# Patient Record
Sex: Male | Born: 1990 | Race: Black or African American | Hispanic: No | Marital: Single | Smoking: Never smoker
Health system: Southern US, Community
[De-identification: ages and names within clinical notes are randomized; demographics above are authoritative.]

---
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
## 2011-12-14 IMAGING — US US PELVIS LIMITED
1 series · 17 of 25 positions shown · non-contrast
Comparison: none

REASON FOR EXAM: Hyrdocele
COMMENTS:

[Series 1: us pelvis limited · 17 of 47 slices shown]
[im 1/47]
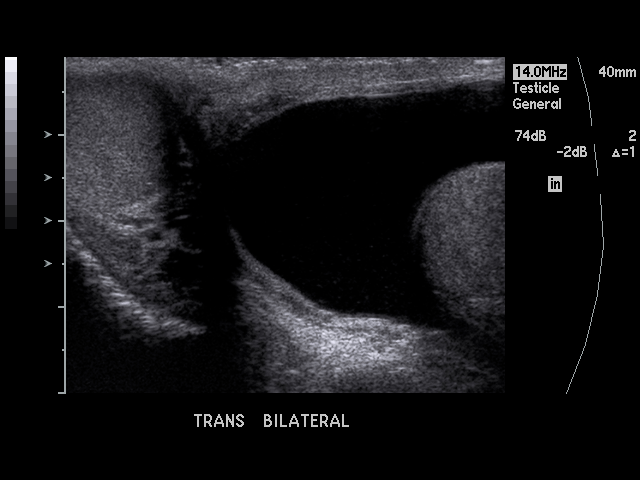
[im 4/47]
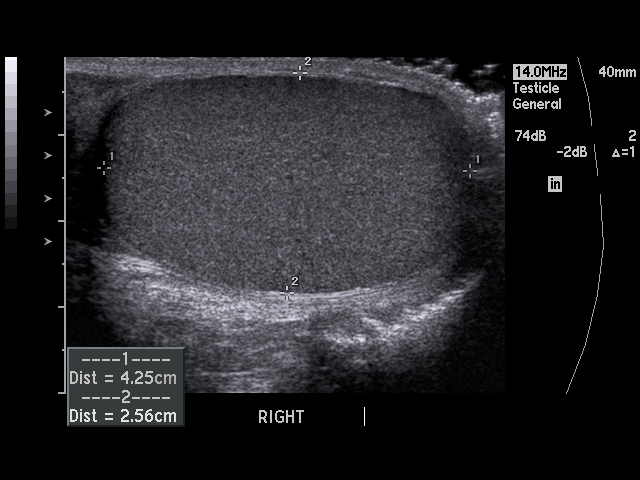
[im 6/47]
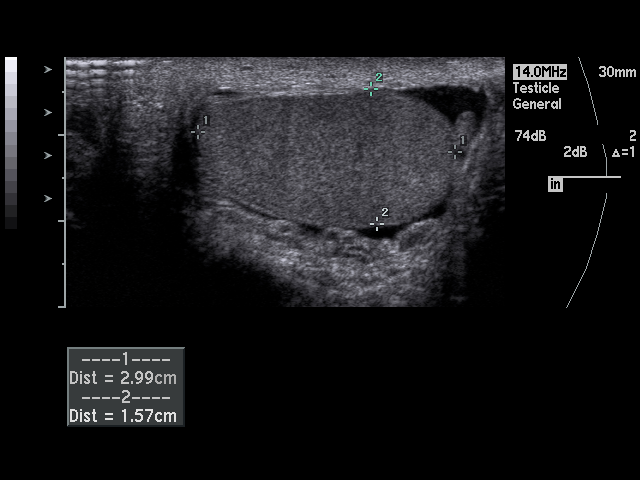
[im 10/47]
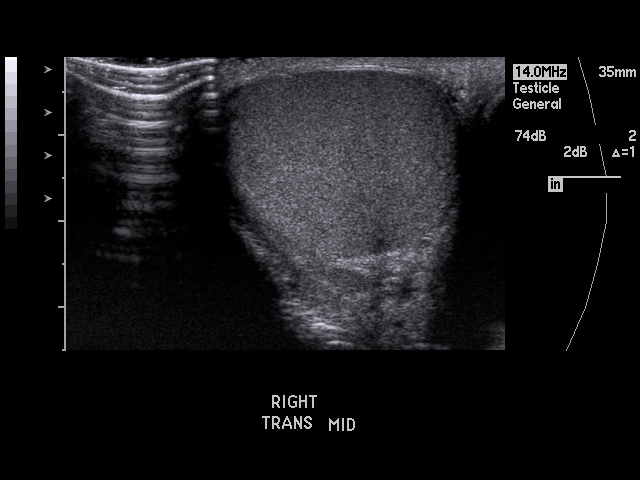
[im 12/47]
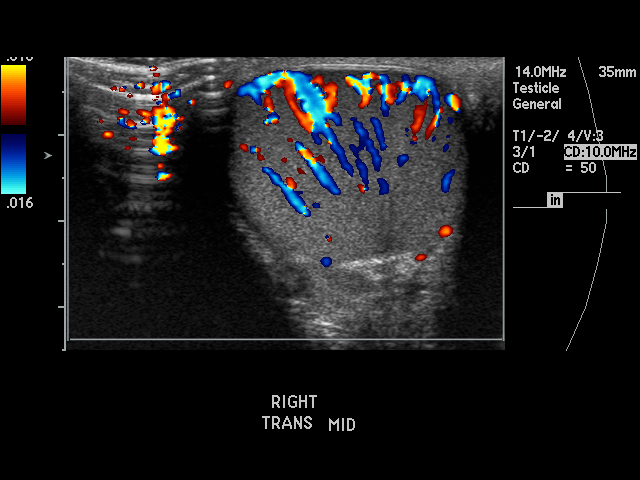
[im 16/47]
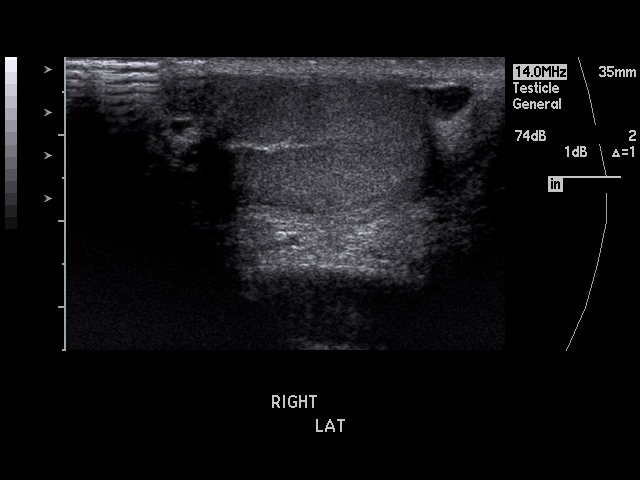
[im 18/47]
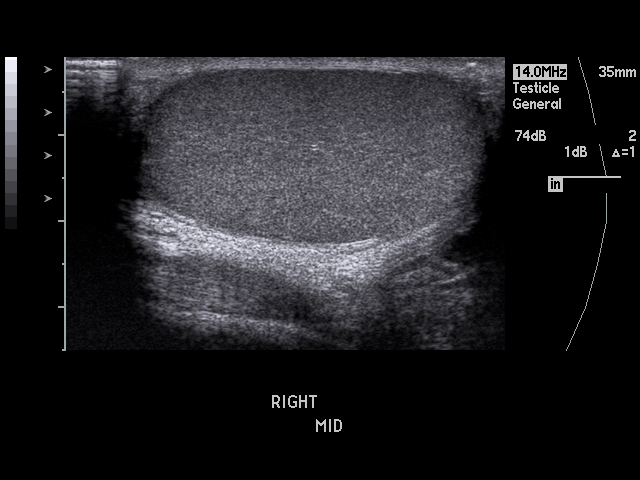
[im 22/47]
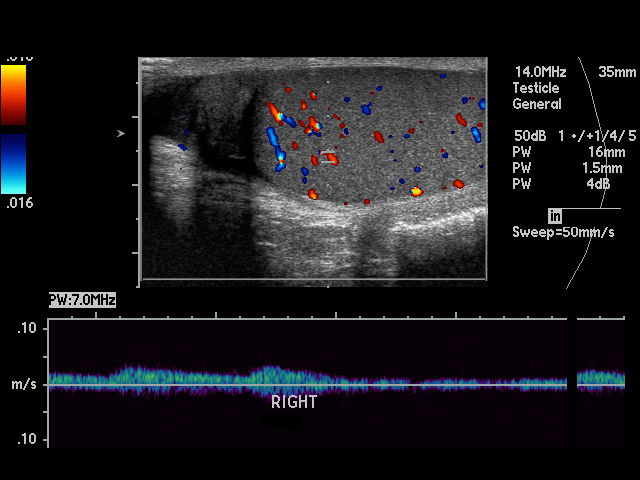
[im 24/47]
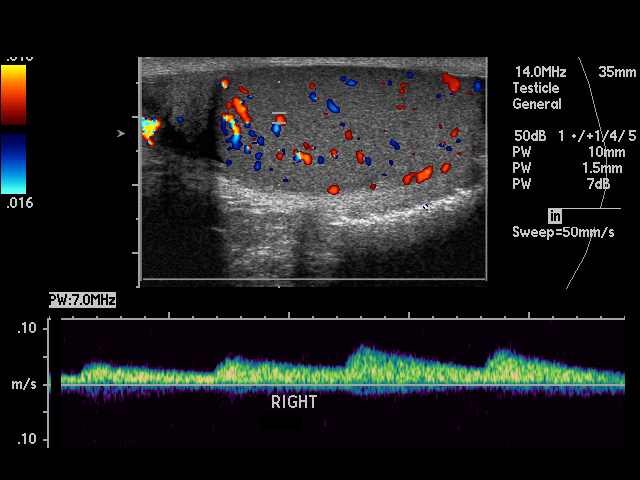
[im 25/47]
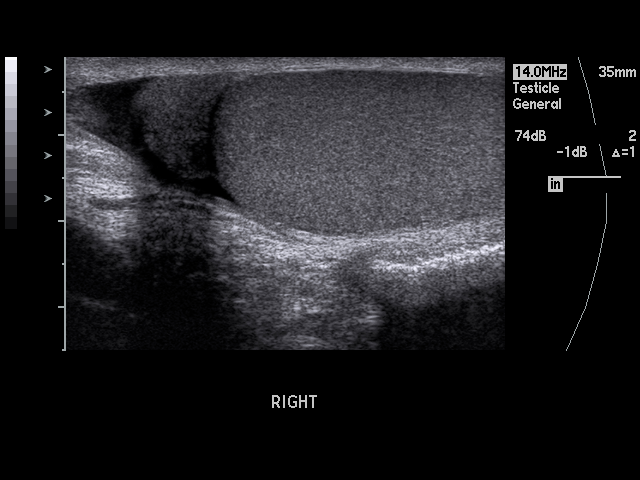
[im 29/47]
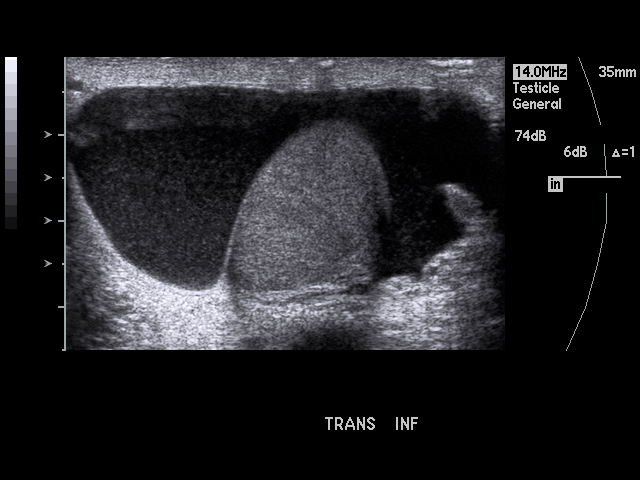
[im 31/47]
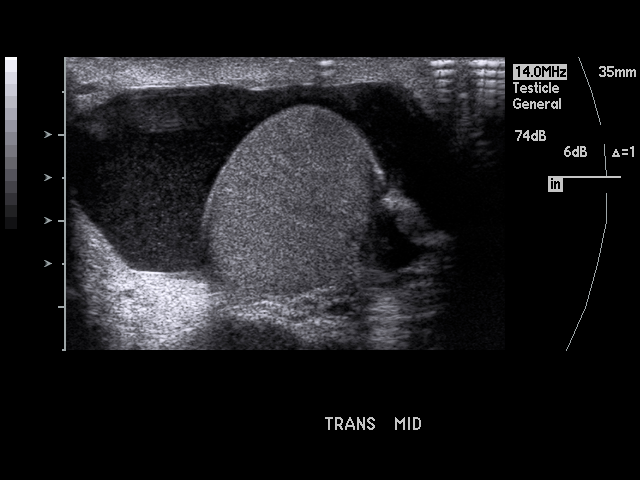
[im 35/47]
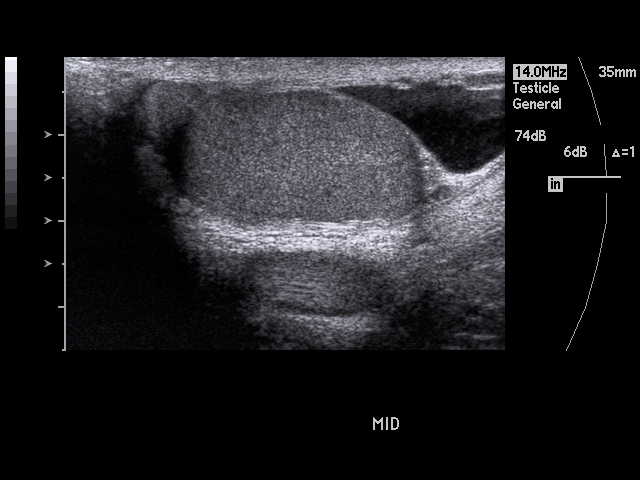
[im 37/47]
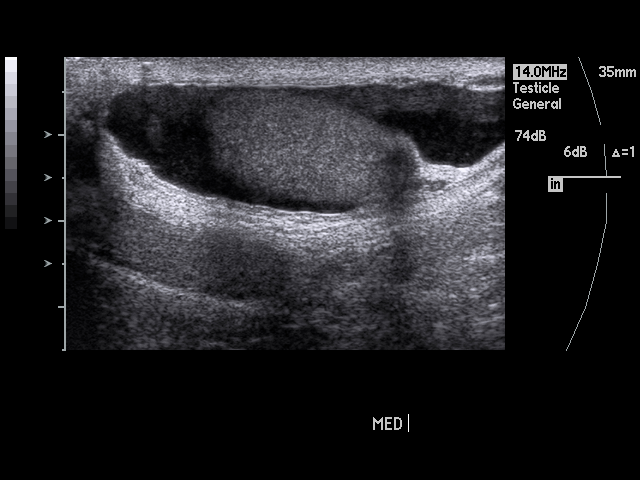
[im 41/47]
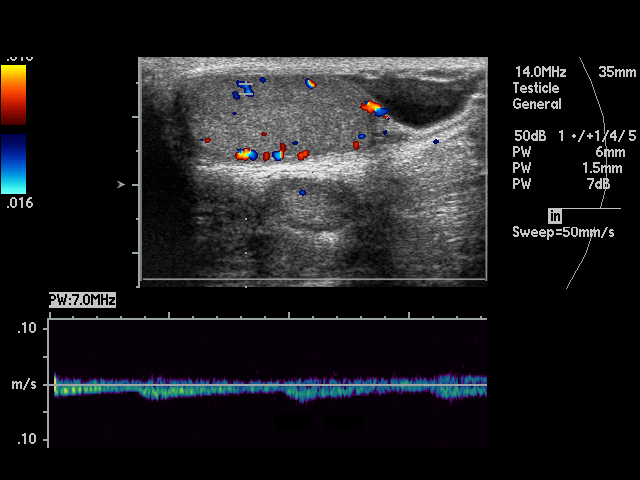
[im 43/47]
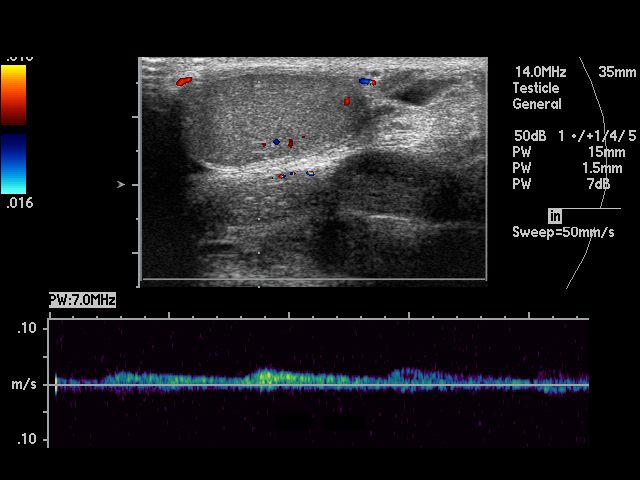
[im 47/47]
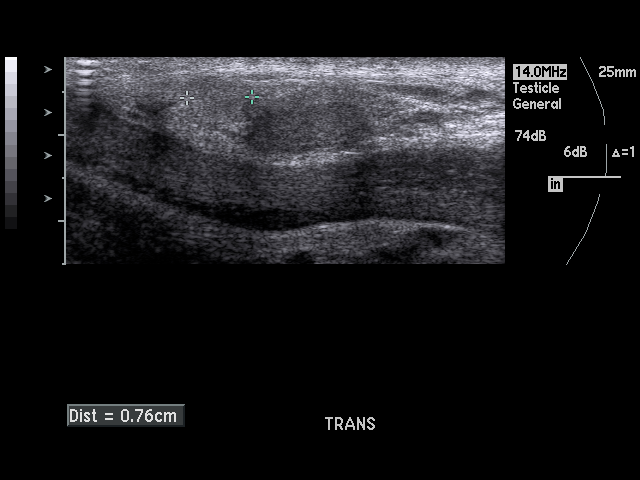

[17 of 25 positions shown; findings below may reference images not displayed]

PROCEDURE:     US  - US TESTICULAR  - March 06, 2010 [DATE]

RESULT:     Ultrasound of the scrotum demonstrates the right testicle is
homogeneous in echotexture measuring 4.25 x 2.56 x 2.59 cm. The left
testicle is also homogeneous measuring 4.57 x 2.99 x 2.15 cm. Color and
Spectral Doppler interrogation demonstrates normal arterial and venous blood
flow bilaterally. There is a left hydrocele present which according to the
patient has been present chronically. The epididymal appearance is normal.
IMPRESSION: Left hydrocele. No focal mass or evidence of testicular torsion.

## 2015-08-24 ENCOUNTER — Ambulatory Visit (INDEPENDENT_AMBULATORY_CARE_PROVIDER_SITE_OTHER): Payer: BLUE CROSS/BLUE SHIELD | Admitting: Podiatry

## 2015-08-24 ENCOUNTER — Ambulatory Visit (INDEPENDENT_AMBULATORY_CARE_PROVIDER_SITE_OTHER): Payer: BLUE CROSS/BLUE SHIELD

## 2015-08-24 ENCOUNTER — Encounter: Payer: Self-pay | Admitting: Podiatry

## 2015-08-24 VITALS — BP 114/56 | HR 67 | Resp 16 | Ht 68.0 in | Wt 142.0 lb

## 2015-08-24 DIAGNOSIS — M79674 Pain in right toe(s): Secondary | ICD-10-CM

## 2015-08-24 DIAGNOSIS — M79675 Pain in left toe(s): Secondary | ICD-10-CM | POA: Diagnosis not present

## 2015-08-24 DIAGNOSIS — M779 Enthesopathy, unspecified: Secondary | ICD-10-CM | POA: Diagnosis not present

## 2015-08-24 DIAGNOSIS — M205X9 Other deformities of toe(s) (acquired), unspecified foot: Secondary | ICD-10-CM | POA: Diagnosis not present

## 2015-08-24 NOTE — Progress Notes (Signed)
   Subjective:    Patient ID: Donald LevinsWilliam Cross, male    DOB: [birth year], 25 y.o.   MRN: [id number]  HPI Chief Complaint  Patient presents with  . Toe Pain    Bilateral; great toes; joint pain; swelling; pt stated, "Was told by primary care doctor not to wear his boots 2 weeks ago; Has had pain since Jan. 2017"      Review of Systems  All other systems reviewed and are negative.      Objective:   Physical Exam        Assessment & Plan:

## 2015-08-25 NOTE — Progress Notes (Signed)
Subjective:     Patient ID: Donald LevinsWilliam Cross, male   DOB: 01-13-1991, 25 y.o.   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      Objective:   Physical Exam  Constitutional: He is oriented to person, place, and time.  Cardiovascular: Intact distal pulses.   Musculoskeletal: Normal range of motion.  Neurological: He is oriented to person, place, and time.  Skin: Skin is warm.  Nursing note and vitals reviewed.  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 Patient's found have good digital perfusion and is well oriented 3 with mild equinus and moderate flatfoot deformity     Assessment:     Probable mechanical structure leading to inflammation and discomfort of the hallux bilateral    Plan:     H&P and x-rays that he brought were reviewed. Today I made him an orthotic with a Morton's extension to try to stabilize the hallux and take pressure off the joint surface. Patient will be seen back when those are returned and was given instructions on padding and I dispensed silicone pads for the big toes

## 2015-09-14 ENCOUNTER — Ambulatory Visit (INDEPENDENT_AMBULATORY_CARE_PROVIDER_SITE_OTHER): Payer: BLUE CROSS/BLUE SHIELD | Admitting: *Deleted

## 2015-09-14 DIAGNOSIS — M779 Enthesopathy, unspecified: Secondary | ICD-10-CM | POA: Diagnosis not present

## 2015-09-14 NOTE — Patient Instructions (Signed)

## 2015-09-14 NOTE — Progress Notes (Signed)
Patient ID: Donald LevinsWilliam Cross, male   DOB: Jun 24, 1990, 24 y.o.   MRN: [id number] Patient presents for orthotic pick up.  Verbal and written break in and wear instructions given.  Patient will follow up in 4 weeks if symptoms worsen or fail to improve.

## 2015-10-06 ENCOUNTER — Inpatient Hospital Stay (HOSPITAL_COMMUNITY)
Admission: EM | Admit: 2015-10-06 | Discharge: 2015-10-09 | DRG: 331 | Disposition: A | Discharge status: Home / Self Care | Payer: Worker's Compensation | Attending: Surgery | Admitting: Surgery

## 2015-10-06 ENCOUNTER — Observation Stay (HOSPITAL_COMMUNITY): Payer: Worker's Compensation | Admitting: Anesthesiology

## 2015-10-06 ENCOUNTER — Encounter (HOSPITAL_COMMUNITY): Payer: Self-pay | Admitting: Emergency Medicine

## 2015-10-06 ENCOUNTER — Encounter (HOSPITAL_COMMUNITY): Admission: EM | Disposition: A | Discharge status: Home / Self Care | Payer: Self-pay | Source: Home / Self Care

## 2015-10-06 DIAGNOSIS — X500XXA Overexertion from strenuous movement or load, initial encounter: Secondary | ICD-10-CM | POA: Diagnosis not present

## 2015-10-06 DIAGNOSIS — R1030 Lower abdominal pain, unspecified: Secondary | ICD-10-CM | POA: Diagnosis present

## 2015-10-06 DIAGNOSIS — K4030 Unilateral inguinal hernia, with obstruction, without gangrene, not specified as recurrent: Secondary | ICD-10-CM | POA: Diagnosis present

## 2015-10-06 DIAGNOSIS — K4090 Unilateral inguinal hernia, without obstruction or gangrene, not specified as recurrent: Secondary | ICD-10-CM

## 2015-10-06 HISTORY — PX: INGUINAL HERNIA REPAIR: SHX194

## 2015-10-06 LAB — BASIC METABOLIC PANEL
ANION GAP: 8 (ref 5–15)
BUN: 12 mg/dL (ref 6–20)
CHLORIDE: 104 mmol/L (ref 101–111)
CO2: 26 mmol/L (ref 22–32)
Calcium: 9.2 mg/dL (ref 8.9–10.3)
Creatinine, Ser: 1.25 mg/dL — ABNORMAL HIGH (ref 0.61–1.24)
GFR calc non Af Amer: >60 mL/min (ref >60)
Glucose, Bld: 141 mg/dL — ABNORMAL HIGH (ref 65–99)
POTASSIUM: 3 mmol/L — AB (ref 3.5–5.1)
Sodium: 138 mmol/L (ref 135–145)

## 2015-10-06 LAB — CBC WITH DIFFERENTIAL/PLATELET
BASOS ABS: 0 10*3/uL (ref 0.0–0.1)
BASOS PCT: 0 %
Eosinophils Absolute: 0 10*3/uL (ref 0.0–0.7)
Eosinophils Relative: 0 %
HEMATOCRIT: 45.4 % (ref 39.0–52.0)
HEMOGLOBIN: 16.1 g/dL (ref 13.0–17.0)
Lymphocytes Relative: 5 %
Lymphs Abs: 0.8 10*3/uL (ref 0.7–4.0)
MCH: 30.7 pg (ref 26.0–34.0)
MCHC: 35.5 g/dL (ref 30.0–36.0)
MCV: 86.5 fL (ref 78.0–100.0)
MONOS PCT: 3 %
Monocytes Absolute: 0.4 10*3/uL (ref 0.1–1.0)
NEUTROS ABS: 13.8 10*3/uL — AB (ref 1.7–7.7)
NEUTROS PCT: 92 %
Platelets: 223 10*3/uL (ref 150–400)
RBC: 5.25 MIL/uL (ref 4.22–5.81)
RDW: 11.9 % (ref 11.5–15.5)
WBC: 15 10*3/uL — ABNORMAL HIGH (ref 4.0–10.5)

## 2015-10-06 SURGERY — REPAIR, HERNIA, INGUINAL, ADULT
Anesthesia: General | Site: Groin | Laterality: Left

## 2015-10-06 MED ORDER — BUPIVACAINE LIPOSOME 1.3 % IJ SUSP
20.0000 mL | Freq: Once | INTRAMUSCULAR | Status: AC
  Administered 2015-10-06: 11 mL
  Filled 2015-10-06: qty 20

## 2015-10-06 MED ORDER — CEFAZOLIN SODIUM-DEXTROSE 2-4 GM/100ML-% IV SOLN
2.0000 g | INTRAVENOUS | Status: AC
  Administered 2015-10-06: 2 g via INTRAVENOUS

## 2015-10-06 MED ORDER — ONDANSETRON 4 MG PO TBDP: 4.0000 mg | ORAL_TABLET | Freq: Four times a day (QID) | ORAL | Status: DC | PRN

## 2015-10-06 MED ORDER — KCL IN DEXTROSE-NACL 20-5-0.9 MEQ/L-%-% IV SOLN
INTRAVENOUS | Status: DC
  Administered 2015-10-06 – 2015-10-09 (×4): via INTRAVENOUS
  Filled 2015-10-06 (×5): qty 1000

## 2015-10-06 MED ORDER — DEXAMETHASONE SODIUM PHOSPHATE 10 MG/ML IJ SOLN
INTRAMUSCULAR | Status: DC | PRN
  Administered 2015-10-06: 10 mg via INTRAVENOUS

## 2015-10-06 MED ORDER — DIPHENHYDRAMINE HCL 25 MG PO CAPS: 25.0000 mg | ORAL_CAPSULE | Freq: Four times a day (QID) | ORAL | Status: DC | PRN

## 2015-10-06 MED ORDER — 0.9 % SODIUM CHLORIDE (POUR BTL) OPTIME
TOPICAL | Status: DC | PRN
  Administered 2015-10-06: 1000 mL

## 2015-10-06 MED ORDER — PHENYLEPHRINE HCL 10 MG/ML IJ SOLN
INTRAMUSCULAR | Status: DC | PRN
  Administered 2015-10-06: 80 ug via INTRAVENOUS

## 2015-10-06 MED ORDER — CEFAZOLIN SODIUM-DEXTROSE 2-4 GM/100ML-% IV SOLN
2.0000 g | Freq: Three times a day (TID) | INTRAVENOUS | Status: AC
  Administered 2015-10-07: 2 g via INTRAVENOUS
  Filled 2015-10-06: qty 100

## 2015-10-06 MED ORDER — FENTANYL CITRATE (PF) 250 MCG/5ML IJ SOLN
INTRAMUSCULAR | Status: AC
  Filled 2015-10-06: qty 5

## 2015-10-06 MED ORDER — FENTANYL CITRATE (PF) 250 MCG/5ML IJ SOLN
INTRAMUSCULAR | Status: DC | PRN
  Administered 2015-10-06 (×3): 50 ug via INTRAVENOUS

## 2015-10-06 MED ORDER — CEFAZOLIN SODIUM-DEXTROSE 2-4 GM/100ML-% IV SOLN
INTRAVENOUS | Status: AC
  Filled 2015-10-06: qty 100

## 2015-10-06 MED ORDER — BUPIVACAINE-EPINEPHRINE 0.5% -1:200000 IJ SOLN
INTRAMUSCULAR | Status: AC
  Filled 2015-10-06: qty 1

## 2015-10-06 MED ORDER — SUCCINYLCHOLINE CHLORIDE 20 MG/ML IJ SOLN
INTRAMUSCULAR | Status: DC | PRN
  Administered 2015-10-06: 100 mg via INTRAVENOUS

## 2015-10-06 MED ORDER — MEPERIDINE HCL 50 MG/ML IJ SOLN: 6.2500 mg | INTRAMUSCULAR | Status: DC | PRN

## 2015-10-06 MED ORDER — HYDROMORPHONE HCL 1 MG/ML IJ SOLN
1.0000 mg | Freq: Once | INTRAMUSCULAR | Status: AC
  Administered 2015-10-06: 1 mg via INTRAVENOUS
  Filled 2015-10-06: qty 1

## 2015-10-06 MED ORDER — CEFAZOLIN SODIUM 1-5 GM-% IV SOLN: 1.0000 g | Freq: Once | INTRAVENOUS | Status: DC

## 2015-10-06 MED ORDER — PROPOFOL 10 MG/ML IV BOLUS
INTRAVENOUS | Status: DC | PRN
  Administered 2015-10-06: 150 mg via INTRAVENOUS

## 2015-10-06 MED ORDER — LACTATED RINGERS IV SOLN
INTRAVENOUS | Status: DC
  Administered 2015-10-06: 19:00:00 via INTRAVENOUS

## 2015-10-06 MED ORDER — ONDANSETRON HCL 4 MG/2ML IJ SOLN
INTRAMUSCULAR | Status: AC
  Filled 2015-10-06: qty 2

## 2015-10-06 MED ORDER — DIAZEPAM 5 MG/ML IJ SOLN
5.0000 mg | Freq: Once | INTRAMUSCULAR | Status: AC
  Administered 2015-10-06: 5 mg via INTRAVENOUS
  Filled 2015-10-06: qty 2

## 2015-10-06 MED ORDER — HYDROMORPHONE HCL 1 MG/ML IJ SOLN: 0.5000 mg | INTRAMUSCULAR | Status: DC | PRN

## 2015-10-06 MED ORDER — LACTATED RINGERS IV SOLN
INTRAVENOUS | Status: DC | PRN
  Administered 2015-10-06: 16:00:00 via INTRAVENOUS

## 2015-10-06 MED ORDER — ROCURONIUM BROMIDE 100 MG/10ML IV SOLN
INTRAVENOUS | Status: AC
  Filled 2015-10-06: qty 1

## 2015-10-06 MED ORDER — LIDOCAINE HCL (CARDIAC) 20 MG/ML IV SOLN
INTRAVENOUS | Status: AC
  Filled 2015-10-06: qty 5

## 2015-10-06 MED ORDER — SUGAMMADEX SODIUM 200 MG/2ML IV SOLN
INTRAVENOUS | Status: DC | PRN
  Administered 2015-10-06: 200 mg via INTRAVENOUS

## 2015-10-06 MED ORDER — ROCURONIUM BROMIDE 100 MG/10ML IV SOLN
INTRAVENOUS | Status: DC | PRN
  Administered 2015-10-06: 5 mg via INTRAVENOUS
  Administered 2015-10-06 (×2): 10 mg via INTRAVENOUS
  Administered 2015-10-06: 25 mg via INTRAVENOUS
  Administered 2015-10-06 (×2): 5 mg via INTRAVENOUS

## 2015-10-06 MED ORDER — DEXAMETHASONE SODIUM PHOSPHATE 10 MG/ML IJ SOLN
INTRAMUSCULAR | Status: AC
  Filled 2015-10-06: qty 1

## 2015-10-06 MED ORDER — OXYCODONE HCL 5 MG/5ML PO SOLN
5.0000 mg | Freq: Once | ORAL | Status: DC | PRN
  Filled 2015-10-06: qty 5

## 2015-10-06 MED ORDER — ONDANSETRON HCL 4 MG/2ML IJ SOLN
4.0000 mg | Freq: Once | INTRAMUSCULAR | Status: AC
  Administered 2015-10-06: 4 mg via INTRAVENOUS
  Filled 2015-10-06: qty 2

## 2015-10-06 MED ORDER — LIDOCAINE HCL (CARDIAC) 20 MG/ML IV SOLN
INTRAVENOUS | Status: DC | PRN
  Administered 2015-10-06: 60 mg via INTRATRACHEAL

## 2015-10-06 MED ORDER — POTASSIUM CHLORIDE IN NACL 20-0.9 MEQ/L-% IV SOLN
INTRAVENOUS | Status: DC
  Filled 2015-10-06: qty 1000

## 2015-10-06 MED ORDER — SUGAMMADEX SODIUM 200 MG/2ML IV SOLN
INTRAVENOUS | Status: AC
  Filled 2015-10-06: qty 2

## 2015-10-06 MED ORDER — ONDANSETRON HCL 4 MG/2ML IJ SOLN: 4.0000 mg | INTRAMUSCULAR | Status: DC | PRN

## 2015-10-06 MED ORDER — OXYCODONE HCL 5 MG PO TABS
5.0000 mg | ORAL_TABLET | ORAL | Status: DC | PRN
  Administered 2015-10-07 (×2): 5 mg via ORAL
  Administered 2015-10-07 – 2015-10-09 (×9): 10 mg via ORAL
  Filled 2015-10-06 (×7): qty 2
  Filled 2015-10-06: qty 1
  Filled 2015-10-06 (×2): qty 2
  Filled 2015-10-06: qty 1
  Filled 2015-10-06: qty 2

## 2015-10-06 MED ORDER — DIAZEPAM 5 MG/ML IJ SOLN
5.0000 mg | Freq: Once | INTRAMUSCULAR | Status: AC
  Administered 2015-10-06: 5 mg via INTRAVENOUS

## 2015-10-06 MED ORDER — METHOCARBAMOL 500 MG PO TABS: 500.0000 mg | ORAL_TABLET | Freq: Four times a day (QID) | ORAL | Status: DC | PRN

## 2015-10-06 MED ORDER — OXYCODONE HCL 5 MG PO TABS
5.0000 mg | ORAL_TABLET | Freq: Once | ORAL | Status: DC | PRN
  Filled 2015-10-06: qty 1

## 2015-10-06 MED ORDER — DIPHENHYDRAMINE HCL 50 MG/ML IJ SOLN: 25.0000 mg | Freq: Four times a day (QID) | INTRAMUSCULAR | Status: DC | PRN

## 2015-10-06 MED ORDER — BUPIVACAINE-EPINEPHRINE 0.5% -1:200000 IJ SOLN
INTRAMUSCULAR | Status: DC | PRN
  Administered 2015-10-06: 5 mL

## 2015-10-06 MED ORDER — MORPHINE SULFATE (PF) 2 MG/ML IV SOLN
2.0000 mg | INTRAVENOUS | Status: DC | PRN
  Administered 2015-10-07 – 2015-10-08 (×4): 2 mg via INTRAVENOUS
  Filled 2015-10-06 (×4): qty 1

## 2015-10-06 MED ORDER — METHOCARBAMOL 500 MG PO TABS
500.0000 mg | ORAL_TABLET | Freq: Four times a day (QID) | ORAL | Status: DC | PRN
Start: 2015-10-06 — End: 2015-10-09

## 2015-10-06 MED ORDER — ONDANSETRON HCL 4 MG/2ML IJ SOLN
INTRAMUSCULAR | Status: DC | PRN
  Administered 2015-10-06: 4 mg via INTRAVENOUS

## 2015-10-06 MED ORDER — PROPOFOL 10 MG/ML IV BOLUS
INTRAVENOUS | Status: AC
  Filled 2015-10-06: qty 20

## 2015-10-06 MED ORDER — HYDRALAZINE HCL 20 MG/ML IJ SOLN
10.0000 mg | INTRAMUSCULAR | Status: DC | PRN
Start: 2015-10-06 — End: 2015-10-06

## 2015-10-06 MED ORDER — ONDANSETRON HCL 4 MG/2ML IJ SOLN: 4.0000 mg | Freq: Four times a day (QID) | INTRAMUSCULAR | Status: DC | PRN

## 2015-10-06 MED ORDER — PHENYLEPHRINE 40 MCG/ML (10ML) SYRINGE FOR IV PUSH (FOR BLOOD PRESSURE SUPPORT)
PREFILLED_SYRINGE | INTRAVENOUS | Status: AC
  Filled 2015-10-06: qty 10

## 2015-10-06 MED ORDER — HYDROMORPHONE HCL 1 MG/ML IJ SOLN: 0.2500 mg | INTRAMUSCULAR | Status: DC | PRN

## 2015-10-06 SURGICAL SUPPLY — 43 items
BENZOIN TINCTURE PRP APPL 2/3 (GAUZE/BANDAGES/DRESSINGS) IMPLANT
BLADE SURG 15 STRL LF DISP TIS (BLADE) IMPLANT
BLADE SURG 15 STRL SS (BLADE)
CHLORAPREP W/TINT 26ML (MISCELLANEOUS) ×3 IMPLANT
CLOSURE WOUND 1/2 X4 (GAUZE/BANDAGES/DRESSINGS)
COVER SURGICAL LIGHT HANDLE (MISCELLANEOUS) IMPLANT
DECANTER SPIKE VIAL GLASS SM (MISCELLANEOUS) ×3 IMPLANT
DRAIN PENROSE 18X1/2 LTX STRL (DRAIN) ×3 IMPLANT
DRAPE INCISE IOBAN 66X45 STRL (DRAPES) IMPLANT
DRAPE LAPAROTOMY TRNSV 102X78 (DRAPE) ×3 IMPLANT
DRSG OPSITE POSTOP 4X6 (GAUZE/BANDAGES/DRESSINGS) ×3 IMPLANT
DRSG TEGADERM 4X4.75 (GAUZE/BANDAGES/DRESSINGS) IMPLANT
ELECT PENCIL ROCKER SW 15FT (MISCELLANEOUS) IMPLANT
ELECT REM PT RETURN 9FT ADLT (ELECTROSURGICAL) ×3
ELECTRODE REM PT RTRN 9FT ADLT (ELECTROSURGICAL) ×1 IMPLANT
GAUZE SPONGE 4X4 12PLY STRL (GAUZE/BANDAGES/DRESSINGS) IMPLANT
GLOVE ECLIPSE 8.0 STRL XLNG CF (GLOVE) ×6 IMPLANT
GLOVE INDICATOR 8.0 STRL GRN (GLOVE) ×3 IMPLANT
GOWN STRL REUS W/TWL XL LVL3 (GOWN DISPOSABLE) ×9 IMPLANT
KIT BASIN OR (CUSTOM PROCEDURE TRAY) ×3 IMPLANT
LIGASURE IMPACT 36 18CM CVD LR (INSTRUMENTS) ×3 IMPLANT
NEEDLE HYPO 25X1 1.5 SAFETY (NEEDLE) ×3 IMPLANT
PACK BASIC VI WITH GOWN DISP (CUSTOM PROCEDURE TRAY) IMPLANT
PACK GENERAL/GYN (CUSTOM PROCEDURE TRAY) ×3 IMPLANT
RELOAD PROXIMATE 75MM BLUE (ENDOMECHANICALS) ×9 IMPLANT
SPONGE LAP 4X18 X RAY DECT (DISPOSABLE) IMPLANT
STAPLER GUN LINEAR PROX 60 (STAPLE) ×3 IMPLANT
STAPLER PROXIMATE 75MM BLUE (STAPLE) ×3 IMPLANT
STAPLER VISISTAT 35W (STAPLE) ×3 IMPLANT
STRIP CLOSURE SKIN 1/2X4 (GAUZE/BANDAGES/DRESSINGS) IMPLANT
SUT MNCRL AB 4-0 PS2 18 (SUTURE) ×3 IMPLANT
SUT PROLENE 0 CT 1 CR/8 (SUTURE) ×3 IMPLANT
SUT PROLENE 2 0 CT2 30 (SUTURE) ×6 IMPLANT
SUT SILK 2 0 SH CR/8 (SUTURE) ×3 IMPLANT
SUT SILK 3 0 SH CR/8 (SUTURE) ×3 IMPLANT
SUT VIC AB 2-0 SH 18 (SUTURE) IMPLANT
SUT VIC AB 3-0 SH 27 (SUTURE) ×4
SUT VIC AB 3-0 SH 27XBRD (SUTURE) ×2 IMPLANT
SYR BULB IRRIGATION 50ML (SYRINGE) ×3 IMPLANT
SYR CONTROL 10ML LL (SYRINGE) ×3 IMPLANT
TOWEL OR 17X26 10 PK STRL BLUE (TOWEL DISPOSABLE) ×3 IMPLANT
TOWEL OR NON WOVEN STRL DISP B (DISPOSABLE) ×3 IMPLANT
YANKAUER SUCT BULB TIP 10FT TU (MISCELLANEOUS) IMPLANT

## 2015-10-06 NOTE — ED Provider Notes (Signed)
CSN: [id number]     Arrival date & time 10/06/15  1254 History   First MD Initiated Contact with Patient 10/06/15 1259     Chief Complaint  Patient presents with  . Hernia     (Consider location/radiation/quality/duration/timing/severity/associated sxs/prior Treatment) HPI  25 year old male who presents with hernia. He has no significant PMH or PSH. States he has otherwise been in his usual state of health and was moving furniture from a truck about 3 hours PTA to ED. Noticed he was having sudden onset of low abdominal cramping and then noticed a bulge in the left groin. Associated with one episode of nausea and vomiting. No abdominal distension. No fever or chills. No difficulty urinating, scrotal swelling or testicular pain.   History reviewed. No pertinent past medical history. History reviewed. No pertinent past surgical history. History reviewed. No pertinent family history. Social History  Substance Use Topics  . Smoking status: Never Smoker   . Smokeless tobacco: None  . Alcohol Use: None    Review of Systems 10/14 systems reviewed and are negative other than those stated in the HPI    Allergies  Review of patient's allergies indicates no known allergies.  Home Medications   Prior to Admission medications   Not on File   BP 148/75 mmHg  Pulse 58  Temp(Src) 97.7 F (36.5 C) (Oral)  Resp 18  SpO2 99% Physical Exam Physical Exam  Nursing note and vitals reviewed. Constitutional: Well developed, well nourished, non-toxic, but in moderate distress secondary to pain Head: Normocephalic and atraumatic.  Mouth/Throat: Oropharynx is clear and moist.  Neck: Normal range of motion. Neck supple.  Cardiovascular: Normal rate and regular rhythm.   Pulmonary/Chest: Effort normal and breath sounds normal.  Abdominal: Soft. No distension. Tender in the low abdomen. Palpable buldge in the left groin at the level of the inguinal ligament.  There is no rebound but presence of  voluntary guarding. Musculoskeletal: Normal range of motion.  Neurological: Alert, no facial droop, fluent speech, moves all extremities symmetrically Skin: Skin is warm and dry.  Psychiatric: Cooperative  ED Course  Procedures (including critical care time) Labs Review Labs Reviewed  CBC WITH DIFFERENTIAL/PLATELET  BASIC METABOLIC PANEL    Imaging Review No results found. I have personally reviewed and evaluated these images and lab results as part of my medical decision-making.   EKG Interpretation None      Reduction of inguinal hernia Date/Time: 3:51 PM Performed by: Lavera Guiseana Duo Lisa Milian Authorized by: Lavera Guiseana Duo Manpreet Kemmer Consent: Verbal consent obtained. Risks and benefits: risks, benefits and alternatives were discussed Consent given by: patient Required items: required blood products, implants, devices, and special equipment available Time out: Immediately prior to procedure a "time out" was called to verify the correct patient, procedure, equipment, support staff and site/side marked as required.  Patient sedated: dilaudid  Vitals: Vital signs were monitored during sedation. Patient tolerance: Patient tolerated the procedure well with no immediate complications. Left incarcerated inguinal hernia Manual pressure applied while patient in Trendelenburg. Two unsuccessful attempts at bedside.     MDM   Final diagnoses:  Left inguinal hernia    With incarcerated inguinal hernia in setting of heavy lifting. No prior abd surgeries. With inguinal hernia on exam, very tender but abdomen non-peritoneal. Given analgesics and antiemetics. Ice pack placed over groin. In trendenlenburg. Manual reduction attempted at beside x 2 without success. General surgery consulted. With valium able to reduce but immediately recurs. Surgery will take to OR for repair.  Lavera Guise, MD 10/06/15 [phone]

## 2015-10-06 NOTE — Anesthesia Procedure Notes (Signed)
Procedure Name: Intubation Date/Time: 10/06/2015 4:13 PM Performed by: Delphia GratesHANDLER, Zyanya Glaza Pre-anesthesia Checklist: Suction available, Patient being monitored, Emergency Drugs available and Patient identified Patient Re-evaluated:Patient Re-evaluated prior to inductionOxygen Delivery Method: Circle system utilized Preoxygenation: Pre-oxygenation with 100% oxygen Intubation Type: Cricoid Pressure applied and Rapid sequence Laryngoscope Size: Mac and 4 Grade View: Grade II Tube type: Oral Tube size: 7.5 mm Number of attempts: 1 Airway Equipment and Method: Stylet Placement Confirmation: ETT inserted through vocal cords under direct vision and positive ETCO2 Secured at: 21 cm Tube secured with: Tape Dental Injury: Teeth and Oropharynx as per pre-operative assessment

## 2015-10-06 NOTE — Progress Notes (Addendum)
[facility] ED CM noted pt with coverage but no pcp listed Pt mumbled "tricare" and returned to sleep Pt has [employer] via  [employer]  [facility] ED CM spoke with pt's male visitor at bedside on how to obtain an in network pcp with insurance coverage via the customer service number or [email or web address] web site  Cm reviewed ED level of care for crisis/emergent services and community pcp level of care to manage continuous or chronic medical concerns.  The pt voiced understanding CM encouraged pt and discussed pt's responsibility to verify with pt's insurance carrier that any recommended medical provider offered by any emergency room or a hospital provider is within the carrier's network. The pt's male visitor voiced understanding to discuss with pt      Entered in d/c instructions Please verify any provider recommended to you is in network Go to Please go to [email or web address], locate find a doctor area in top right corner of page to use to find in network primary care provider and specialists or call the toll free number on the back of your insurance card to speak with a customer service staff

## 2015-10-06 NOTE — Anesthesia Postprocedure Evaluation (Signed)
Anesthesia Post Note  Patient: Donald LevinsWilliam Cross  Procedure(s) Performed: Procedure(s) (LRB): HERNIA REPAIR INGUINAL ADULT (Left)  Patient location during evaluation: PACU Anesthesia Type: General Level of consciousness: awake and alert Pain management: pain level controlled Vital Signs Assessment: post-procedure vital signs reviewed and stable Respiratory status: spontaneous breathing, nonlabored ventilation, respiratory function stable and patient connected to nasal cannula oxygen Cardiovascular status: blood pressure returned to baseline and stable Postop Assessment: no signs of nausea or vomiting Anesthetic complications: no    Last Vitals:  Filed Vitals:   10/06/15 1841 10/06/15 1940  BP: 148/80 157/88  Pulse: 84 57  Temp: 37.3 C 36.5 C  Resp: 16 17    Last Pain:  Filed Vitals:   10/06/15 1943  PainSc: Asleep                 Natash Berman A

## 2015-10-06 NOTE — Progress Notes (Signed)
Patient seen and examined.  Chart reviewed.  He has an incarcerated left inguinal hernia that I can reduce but then it returns to incarcerated state after a brief period of time and needs to be reduced again.  He needs emergency repair of his left inguinal hernia, +/- mesh.  I have explained the procedure, risks, and aftercare of inguinal hernia repair.  Risks include but are not limited to bleeding, infection, wound problems, anesthesia, recurrence, bladder or intestine injury, testicular dysfunction, chronic pain, mesh problems.  He has received some sedation but seems to understand.  I have declared this an emergency.

## 2015-10-06 NOTE — H&P (Signed)
[facility] Admission Note Donald LevinsWilliam Cross 06/11/1990  [id number].   Requesting MD: Dr. Lavera Guiseana [name] Chief Complaint/Reason for Consult: left inguinal hernia  HPI:  25 y/o male with a PMH of left hydrocele [facility] sudden abdominal cramping and nausea following heavy lifting. Friend in the room with him. 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 The pain was sudden and sharp, non-radiating and associated with nausea and one episode of vomiting. No fever or chills.This has never happened before. No history of abdominal surgeries. No regular medication use.  ROS: All systems reviewed and otherwise negative except for as above  History reviewed. No pertinent family history.  History reviewed. No pertinent past medical history.  History reviewed. No pertinent past surgical history.  Social History:  reports that he has never smoked. He does not have any smokeless tobacco history on file. His alcohol and drug histories are not on file.  Allergies: No Known Allergies   (Not in a hospital admission)  Blood pressure 148/75, pulse 58, temperature 97.7 F (36.5 C), temperature source Oral, resp. rate 18, SpO2 99 %. Physical Exam: General: pleasant, thin AA male who is laying in bed in mild distress HEENT: head is normocephalic, atraumatic.  Heart: regular, rate, and rhythm. No obvious murmurs, gallops, or rubs noted. Palpable pedal pulses bilaterally Lungs: CTAB, no wheezes, rhonchi, or rales noted. Respiratory effort nonlabored Abd: soft, TTP of lower quadrants, ND, +BS, left inguinal hernia that is reducible but recurrent. Left hydrocele. MS: all 4 extremities are symmetrical with no cyanosis, clubbing, or edema. Skin: warm and dry with no masses, lesions, or rashes Psych: A&Ox3 with an appropriate affect.   Lab Results Last 48 Hours    No results found for this or any previous visit (from the past 48 hour(s)).    Imaging  Results (Last 48 hours)    No results found.    Assessment/Plan 1. Admit for emergent left hernia repair - risks including bleeding, damage to surrounding structures, testicular pain, rejection of mesh, chronic testicular/abdominal pain, and respiratory compromise discussed with the patient and the patient agrees to proceed with procedure.  2. NPO, IVF, pain control, antiemetics, 2 g Ancef as single prophylactic dose 3. SCD's for DVT proph 4. Dispo: OR   Adam PhenixElizabeth S Simaan, [facility] 10/06/2015, 3:16 PM Pager: [phone] Mon-Fri 7:00 am-4:30 pm Sat-Sun 7:00 am-11:30 am

## 2015-10-06 NOTE — ED Notes (Signed)
Bed: WA07 Expected date:  Expected time:  Means of arrival:  Comments: Triage 1  

## 2015-10-06 NOTE — ED Notes (Signed)
Unable to collect labs staff working with patient

## 2015-10-06 NOTE — ED Notes (Signed)
Was moving furniture when he felt a sharp pain in his lower abdomin and noticed a large mass appear. Urgent care told him he had a hernia and sent him here. Patient is rolling around in pain in triage.

## 2015-10-06 NOTE — Anesthesia Preprocedure Evaluation (Signed)
Anesthesia Evaluation  Patient identified by MRN, date of birth, ID band Patient awake    Reviewed: Allergy & Precautions, NPO status , Patient's Chart, lab work & pertinent test results  Airway Mallampati: I  TM Distance: >3 FB Neck ROM: Full    Dental  (+) Teeth Intact, Dental Advisory Given   Pulmonary    breath sounds clear to auscultation       Cardiovascular  Rhythm:Regular Rate:Normal     Neuro/Psych    GI/Hepatic   Endo/Other    Renal/GU      Musculoskeletal   Abdominal   Peds  Hematology   Anesthesia Other Findings   Reproductive/Obstetrics                             Anesthesia Physical Anesthesia Plan  ASA: I and emergent  Anesthesia Plan: General   Post-op Pain Management:    Induction: Intravenous, Rapid sequence and Cricoid pressure planned  Airway Management Planned: Oral ETT  Additional Equipment:   Intra-op Plan:   Post-operative Plan: Extubation in OR  Informed Consent: I have reviewed the patients History and Physical, chart, labs and discussed the procedure including the risks, benefits and alternatives for the proposed anesthesia with the patient or authorized representative who has indicated his/her understanding and acceptance.   Dental advisory given  Plan Discussed with: CRNA, Anesthesiologist and Surgeon  Anesthesia Plan Comments:         Anesthesia Quick Evaluation  

## 2015-10-06 NOTE — Transfer of Care (Signed)
Immediate Anesthesia Transfer of Care Note  Patient: Donald LevinsWilliam Cross  Procedure(s) Performed: Procedure(s): HERNIA REPAIR INGUINAL ADULT (Left)  Patient Location: PACU  Anesthesia Type:General  Level of Consciousness: awake and patient cooperative  Airway & Oxygen Therapy: Patient Spontanous Breathing and Patient connected to face mask oxygen  Post-op Assessment: Report given to RN and Post -op Vital signs reviewed and stable  Post vital signs: Reviewed and stable  Last Vitals:  Filed Vitals:   10/06/15 1256 10/06/15 1502  BP: 117/82 148/75  Pulse: 97 58  Temp: 36.5 C   Resp: 20 18    Last Pain:  Filed Vitals:   10/06/15 1503  PainSc: 7          Complications: No apparent anesthesia complications

## 2015-10-06 NOTE — Consult Note (Deleted)
[facility] Admission Note Donald LevinsWilliam Cross 1990/06/09  [id number].    Requesting MD: Dr. Lavera Guiseana Duo Cross Chief Complaint/Reason for Consult: left inguinal hernia  HPI:  25 y/o male with a PMH of left hydrocele [facility] sudden abdominal cramping and nausea following heavy lifting. Friend in the room with him. 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 The pain was sudden and sharp, non-radiating and associated with nausea and one episode of vomiting. No fever or chills.This has never happened before. No history of abdominal surgeries. No regular medication use.  ROS: All systems reviewed and otherwise negative except for as above  History reviewed. No pertinent family history.  History reviewed. No pertinent past medical history.  History reviewed. No pertinent past surgical history.  Social History:  reports that he has never smoked. He does not have any smokeless tobacco history on file. His alcohol and drug histories are not on file.  Allergies: No Known Allergies   (Not in a hospital admission)  Blood pressure 148/75, pulse 58, temperature 97.7 F (36.5 C), temperature source Oral, resp. rate 18, SpO2 99 %. Physical Exam: General: pleasant, thin AA male who is laying in bed in mild distress HEENT: head is normocephalic, atraumatic.  Heart: regular, rate, and rhythm.  No obvious murmurs, gallops, or rubs noted.  Palpable pedal pulses bilaterally Lungs: CTAB, no wheezes, rhonchi, or rales noted.  Respiratory effort nonlabored Abd: soft, TTP of lower quadrants, ND, +BS, left inguinal hernia that is reducible but recurrent. Left hydrocele. MS: all 4 extremities are symmetrical with no cyanosis, clubbing, or edema. Skin: warm and dry with no masses, lesions, or rashes Psych: A&Ox3 with an appropriate affect.  No results found for this or any previous visit (from the past 48 hour(s)). No results found.  Assessment/Plan 1.  Admit for  emergent left hernia repair - risks including bleeding, damage to surrounding structures, testicular pain, rejection of mesh, chronic testicular/abdominal pain, and respiratory compromise discussed with the patient and the patient agrees to proceed with procedure.  2.  NPO, IVF, pain control, antiemetics, 2 g Ancef as single prophylactic dose 3.  SCD's for DVT proph 4. Dispo: OR   Donald PhenixElizabeth S Zaven Cross, [facility] 10/06/2015, 3:16 PM Pager: [phone] Mon-Fri 7:00 am-4:30 pm Sat-Sun 7:00 am-11:30 am

## 2015-10-06 NOTE — Op Note (Signed)
Operative Note  Donald LevinsWilliam Sanger male 25 y.o. 10/06/2015  PREOPERATIVE DX:  Incarcerated left inguinal hernia  POSTOPERATIVE DX:  Strangulated left inguinal hernia (30 cm of small bowel)  PROCEDURE:   Emergency repair of strangulated left inguinal hernia, small bowel resection.         Surgeon: Adolph PollackOSENBOWER,Jalene Lacko J   Assistants: Bailey MechLiz Simaan, PA  Anesthesia: General endotracheal anesthesia  Indications:   This is a 25 year old male who was told that he had a left hydrocele many years ago. He was on the job doing some heavy lifting and had acute left groin pain and swelling. He presented to the emergency department and we were asked to see him because of an incarcerated left inguinal hernia. This was only transiently reducible and not he is brought to the operating room for emergency repair.    Procedure Detail:  My initials were placed on his left groin. He is brought to the operating room and placed supine on the operating table and general anesthetic was given. A hearing the lower abdomen and groin areas were clipped. The lower abdomen and groins and scrotum and penis were sterilely prepped and draped. A timeout was performed.  1/2 % Marcaine was infiltrated superficially in the left groin. An incision is made to skin and subcutaneous tissue and Scarpa's fascia exposing the external block aponeurosis. I made an incision in the external block aponeurosis through the external ring medially toward the anterosuperior iliac spine laterally. A bloody fluid filled sac was encountered and opened and bloody fluid was evacuated. I then grasped the hernia sac contents which was non-viable small bowel measuring approximately 30 cm. Hernia sac was stuck to the testicle.  I was able to bring proximal and distal small bowel into the wound through the patulous indirect defect. Using the GIA stapler, I divided the small bowel proximally and distally to the nonviable area. Mesentery was divided close to the small  intestine with the LigaSure. The specimen, measuring 30 cm and not viable, was passed off the field.  A side to side stapled anastomosis was performed with the GIA stapler. The common defect was closed with a linear noncutting stapler. There is some bleeding from the anterior external staple line and this was reinforced with interrupted 3-0 silk sutures. A crotch stitch of 3-0 silk was placed. The anastomosis was patent, viable, and under no tension. It was then placed back through the hernia sac into the peritoneal cavity.  I then identified the spermatic cord and separated from the hernia sac. I ligated the hernia sac with a 2-0 silk pursestring suture. I did note that the left testicle appeared to be smaller than the right and was more high riding.  Next, the indirect hernia defect was approached. In the proximal internal oblique aponeurosis. The conjoined tendon was identified and grasped. Using interrupted 0 Prolene sutures, a modified Bassini repair was done to repair the indirect hernia. Sutures were placed between the conjoined tendon and the shelving edge of the inguinal ligament beginning at the pubic tubercle. The internal ring was tight but not strangulating the spermatic cord.  The wound was irrigated and hemostasis was adequate. The external oblique aponeurosis was closed over the spermatic cord with a running 3-0 Vicryl suture. The subcutaneous tissue was closed with a running 3-0 Vicryl suture. The skin was closed with staples. A sterile dressing was applied. The testicle was in the midpoint in the left scrotum.  He tolerated the procedure well without apparent complications. He was taken to  the recovery room in satisfactory condition.    Findings:  30 cm of strangulated small bowel and an indirect hernia sac extending into the left hemiscrotum.  Estimated Blood Loss:  150 ml        Specimens: Small bowel        Complications:  * No complications entered in OR log *          Disposition: PACU - hemodynamically stable.         Condition: stable

## 2015-10-07 ENCOUNTER — Encounter (HOSPITAL_COMMUNITY): Payer: Self-pay | Admitting: General Surgery

## 2015-10-07 NOTE — Progress Notes (Signed)
1 Day Post-Op  Subjective: Feels better then he did before surgery.  We discussed the surgical findings and the procedure in detail.    Objective: Vital signs in last 24 hours: Temp:  [97.7 F (36.5 C)-99.7 F (37.6 C)] 98.4 F (36.9 C) (06/02 0602) Pulse Rate:  [54-106] 54 (06/02 0602) Resp:  [15-20] 17 (06/02 0602) BP: (117-157)/(54-88) 124/86 mmHg (06/02 0602) SpO2:  [94 %-100 %] 100 % (06/02 0602) Last BM Date: 10/06/15  Intake/Output from previous day: 06/01 0701 - 06/02 0700 In: 3100 [P.O.:240; I.V.:2860] Out: 1250 [Urine:1150; Blood:100] Intake/Output this shift:    PE: General- In NAD Abdomen-soft, not tender, active bowel sounds GU-left groin incision is clean and intact with minimal swelling  Lab Results:   Recent Labs  10/06/15 1540  WBC 15.0*  HGB 16.1  HCT 45.4  PLT 223   BMET  Recent Labs  10/06/15 1540  NA 138  K 3.0*  CL 104  CO2 26  GLUCOSE 141*  BUN 12  CREATININE 1.25*  CALCIUM 9.2   PT/INR No results for input(s): LABPROT, INR in the last 72 hours. Comprehensive Metabolic Panel:    Component Value Date/Time   NA 138 10/06/2015 1540   K 3.0* 10/06/2015 1540   CL 104 10/06/2015 1540   CO2 26 10/06/2015 1540   BUN 12 10/06/2015 1540   CREATININE 1.25* 10/06/2015 1540   GLUCOSE 141* 10/06/2015 1540   CALCIUM 9.2 10/06/2015 1540     Studies/Results: No results found.  Anti-infectives: Anti-infectives    Start     Dose/Rate Route Frequency Ordered Stop   10/07/15 0600  ceFAZolin (ANCEF) IVPB 2g/100 mL premix     2 g 200 mL/hr over 30 Minutes Intravenous On call to O.R. 10/06/15 1533 10/07/15 0722   10/07/15 0000  ceFAZolin (ANCEF) IVPB 2g/100 mL premix     2 g 200 mL/hr over 30 Minutes Intravenous Every 8 hours 10/06/15 1930 10/07/15 0055   10/06/15 1845  ceFAZolin (ANCEF) IVPB 1 g/50 mL premix  Status:  Discontinued     1 g 100 mL/hr over 30 Minutes Intravenous  Once 10/06/15 1836 10/06/15 1839       Assessment Principal Problem:   Strangulated left inguinal hernia s/p emergency repair without mesh and small bowel resection (30 cm) 10/06/15-looks better this AM    LOS: 1 day   Plan: Clear liquids.  OOB.   Mike Berntsen J 10/07/2015

## 2015-10-08 NOTE — Progress Notes (Signed)
Patient ID: Donald LevinsWilliam Cross, male   DOB: 17-Feb-1991, 25 y.o.   MRN: [id number]  General Surgery [facility]  Subjective: POD#2 - patient in bed, comfortable, tolerating clear liquid diet.  Ambulated.  Passing flatus, no BM.  Objective: Vital signs in last 24 hours: Temp:  [98.2 F (36.8 C)-99.1 F (37.3 C)] 99.1 F (37.3 C) (06/03 0527) Pulse Rate:  [53-70] 64 (06/03 0527) Resp:  [16] 16 (06/03 0527) BP: (111-145)/(60-79) 117/60 mmHg (06/03 0527) SpO2:  [100 %] 100 % (06/03 0527) Weight:  [63.957 kg (141 lb)] 63.957 kg (141 lb) (06/03 0500) Last BM Date: 10/06/15  Intake/Output from previous day: 06/02 0701 - 06/03 0700 In: 2255 [P.O.:480; I.V.:1775] Out: 1600 [Urine:1600] Intake/Output this shift:    Physical Exam: HEENT - sclerae clear, mucous membranes moist Neck - soft Chest - clear bilaterally Cor - RRR Abdomen - soft without distension; BS present; wound dry and intact Ext - no edema, non-tender Neuro - alert & oriented, no focal deficits  Lab Results:   Recent Labs  10/06/15 1540  WBC 15.0*  HGB 16.1  HCT 45.4  PLT 223   BMET  Recent Labs  10/06/15 1540  NA 138  K 3.0*  CL 104  CO2 26  GLUCOSE 141*  BUN 12  CREATININE 1.25*  CALCIUM 9.2   PT/INR No results for input(s): LABPROT, INR in the last 72 hours. Comprehensive Metabolic Panel:    Component Value Date/Time   NA 138 10/06/2015 1540   K 3.0* 10/06/2015 1540   CL 104 10/06/2015 1540   CO2 26 10/06/2015 1540   BUN 12 10/06/2015 1540   CREATININE 1.25* 10/06/2015 1540   GLUCOSE 141* 10/06/2015 1540   CALCIUM 9.2 10/06/2015 1540    Studies/Results: No results found.  Assessment & Plans: Status post LIH repair and small bowel resection  Advance to full liquid diet  OOB, ambulate  Velora Hecklerodd M. Normal Recinos, MD, [facility] Office: [phone]   Baeleigh Devincent Judie PetitM 10/08/2015

## 2015-10-09 MED ORDER — OXYCODONE HCL 5 MG PO TABS: 5.0000 mg | ORAL_TABLET | ORAL | Status: AC | PRN

## 2015-10-09 NOTE — Discharge Summary (Signed)
Physician Discharge Summary [facility]  Patient ID: Donald Cross MRN: [id number] DOB/AGE: [birth year] 24 y.o.  Admit date: 10/06/2015 Discharge date: 10/09/2015  Admission Diagnoses:  LIH, strangulated  Discharge Diagnoses:  Principal Problem:   Strangulated inguinal hernia   Discharged Condition: good  Hospital Course: patient admitted with incarcerated LIH.  At surgery, small bowel resection with repair of hernia.  Post op course uncomplicated.  Tolerating diet, pain controlled, ambulatory.  BM on day of discharge.  Prepared for discharge on POD#3.  Consults: None  Treatments: surgery: repair LIH, small bowel resection  Discharge Exam: Blood pressure 137/86, pulse 60, temperature 98.6 F (37 C), temperature source Oral, resp. rate 16, height 5\' 8"  (1.727 m), weight 63.957 kg (141 lb), SpO2 99 %. HEENT - clear Neck - soft Chest - clear bilaterally Cor - RRR Abd - soft, scaphoid, BS present; LIH wound dry and intact with staples  Disposition: Home  Discharge Instructions    Diet - low sodium heart healthy    Complete by:  As directed      Discharge instructions    Complete by:  As directed   [facility]  [facility]ERNIA REPAIR POST OP INSTRUCTIONS  Always review your discharge instruction sheet given to you by the facility where your surgery was performed.  A  prescription for pain medication may be given to you upon discharge.  Take your pain medication as prescribed.  If narcotic pain medicine is not needed, then you may take acetaminophen (Tylenol) or ibuprofen (Advil) as needed.  Take your usually prescribed medications unless otherwise directed.  If you need a refill on your pain medication, please contact your pharmacy.  They will contact our office to request authorization. Prescriptions will not be filled after 5 pm daily or on weekends.  You should follow a light diet the first 24 hours after arrival home, such as soup and  crackers or toast.  Be sure to include plenty of fluids daily.  Resume your normal diet the day after surgery.  Most patients will experience some swelling and bruising around the surgical site.  Ice packs and reclining will help.  Swelling and bruising can take several days to resolve.   It is common to experience some constipation if taking pain medication after surgery.  Increasing fluid intake and taking a stool softener (such as Colace) will usually help or prevent this problem from occurring.  A mild laxative (Milk of Magnesia or Miralax) should be taken according to package directions if there are no bowel movements after 48 hours.  Unless discharge instructions indicate otherwise, you may remove your bandages 24-48 hours after surgery, and you may shower at that time.  You will have steri-strips (small skin tapes) in place directly over the incision.  These strips should be left on the skin for 5-7 days.  Any sutures or staples will be removed at the office during your follow-up visit.  ACTIVITIES:  You may resume regular (light) daily activities beginning the next day - such as daily self-care, walking, climbing stairs - gradually increasing activities as tolerated.  You may have sexual intercourse when it is comfortable.  Refrain from any heavy lifting or straining until approved by your doctor.  You may drive when you are no longer taking prescription pain medication, you can comfortably wear a seatbelt, and you can safely maneuver your car and apply brakes.  You should see your doctor in the office for a follow-up appointment approximately 2-3 weeks  after your surgery.  Make sure that you call for this appointment within a day or two after you arrive home to insure a convenient appointment time.   WHEN TO CALL YOUR DOCTOR: Fever greater than 101.0 Inability to urinate Persistent nausea and/or vomiting Extreme swelling or bruising Continued bleeding from incision Increased pain,  redness, or drainage from the incision  The clinic staff is available to answer your questions during regular business hours.  Please don't hesitate to call and ask to speak to one of the nurses for clinical concerns.  If you have a medical emergency, go to the nearest emergency room or call 911.  A surgeon from [facility] is always on call for the hospital.   [facility] [address], [address], [place], [place]  [address]  [phone] ? [phone] ? FAX [phone]  [email or web address]     Increase activity slowly    Complete by:  As directed      No dressing needed    Complete by:  As directed             Medication List    TAKE these medications        oxyCODONE 5 MG immediate release tablet  Commonly known as:  Oxy IR/ROXICODONE  Take 1-2 tablets (5-10 mg total) by mouth every 4 (four) hours as needed for moderate pain.           Follow-up Information    Go to Please verify any provider recommended to you is in network.   Contact information:   Please go to [email or web address] locate find a doctor area in top right corner of page to use to find in network primary care provider and specialists or call the toll free number on the back of your insurance card to speak with a customer service staff       Follow up with [facility]. Schedule an appointment as soon as possible for a visit in 5 days.   Specialty:  General Surgery   Why:  For suture removal   Contact information:   [address] [place] [address] [phone]      Velora Heckler, MD, [facility] Office: [phone]   Signed: Velora Heckler 10/09/2015, 7:36 AM

## 2015-10-09 NOTE — Progress Notes (Signed)
Assessment unchanged. Pt verbalized understanding of dc instructions through teach back including when to call the doctor and follow up care. Script x 1 given as provided by MD. Discharged via wc to front entrance accompanied by NT and friends x 2.

## 2019-10-12 ENCOUNTER — Ambulatory Visit: Payer: BLUE CROSS/BLUE SHIELD | Attending: Internal Medicine

## 2019-10-12 DIAGNOSIS — Z23 Encounter for immunization: Secondary | ICD-10-CM

## 2019-10-12 NOTE — Progress Notes (Signed)
   Covid-19 Vaccination Clinic  Name:  Donald Cross    MRN: [id number] DOB: 01/27/1991  10/12/2019  Donald Cross was observed post Covid-19 immunization for 15 minutes without incident. He was provided with Vaccine Information Sheet and instruction to access the V-Safe system.   Donald Cross was instructed to call 911 with any severe reactions post vaccine: Marland Kitchen Difficulty breathing  . Swelling of face and throat  . A fast heartbeat  . A bad rash all over body  . Dizziness and weakness   Immunizations Administered    Name Date Dose VIS Date Route   Pfizer COVID-19 Vaccine 10/12/2019  8:16 AM 0.3 mL 07/01/2018 Intramuscular   Manufacturer: [employer], [employer]   Lot: [id number]   NDC: [address]-6

## 2019-11-02 ENCOUNTER — Ambulatory Visit: Payer: Self-pay | Attending: Internal Medicine

## 2019-11-02 DIAGNOSIS — Z23 Encounter for immunization: Secondary | ICD-10-CM

## 2019-11-02 NOTE — Progress Notes (Signed)
   Covid-19 Vaccination Clinic  Name:  Donald Cross    MRN: [id number] DOB: 08/31/1990  11/02/2019  [name] was observed post Covid-19 immunization for 15 minutes without incident. He was provided with Vaccine Information Sheet and instruction to access the V-Safe system.   [name] was instructed to call 911 with any severe reactions post vaccine: Marland Kitchen Difficulty breathing  . Swelling of face and throat  . A fast heartbeat  . A bad rash all over body  . Dizziness and weakness   Immunizations Administered    Name Date Dose VIS Date Route   Pfizer COVID-19 Vaccine 11/02/2019  8:51 AM 0.3 mL 07/01/2018 Intramuscular   Manufacturer: [employer], [employer]   Lot: [id number]   NDC: [address]-1
